# Patient Record
Sex: Male | Born: 1970 | Race: Black or African American | Hispanic: No | Marital: Married
Health system: Southern US, Community
[De-identification: ages and names within clinical notes are randomized; demographics above are authoritative.]

---

## 2003-01-04 ENCOUNTER — Emergency Department (HOSPITAL_COMMUNITY): Admission: EM | Admit: 2003-01-04 | Discharge: 2003-01-04 | Payer: Self-pay | Admitting: Emergency Medicine

## 2003-05-11 ENCOUNTER — Emergency Department (HOSPITAL_COMMUNITY): Admission: EM | Admit: 2003-05-11 | Discharge: 2003-05-11 | Payer: Self-pay | Admitting: Family Medicine

## 2003-11-10 ENCOUNTER — Encounter: Admission: RE | Admit: 2003-11-10 | Discharge: 2003-11-10 | Payer: Self-pay | Admitting: Family Medicine

## 2003-11-20 ENCOUNTER — Ambulatory Visit (HOSPITAL_COMMUNITY): Admission: RE | Admit: 2003-11-20 | Discharge: 2003-11-20 | Payer: Self-pay | Admitting: Obstetrics and Gynecology

## 2007-03-30 ENCOUNTER — Emergency Department: Payer: Self-pay | Admitting: Emergency Medicine

## 2008-06-02 ENCOUNTER — Emergency Department (HOSPITAL_COMMUNITY): Admission: EM | Admit: 2008-06-02 | Discharge: 2008-06-02 | Payer: Self-pay | Admitting: Emergency Medicine

## 2008-12-10 IMAGING — CT CT HEAD WITHOUT CONTRAST
2 series · 16 of 30 positions shown, 20 images · non-contrast
Comparison: none

REASON FOR EXAM: syncope
COMMENTS:   LMP: (Male)

PROCEDURE:     CT  - CT HEAD WITHOUT CONTRAST  - March 30, 2007  [DATE]
RESULT:     Comparison: No available comparison exam.
Procedure: CT examination of the head was performed without intravenous
contrast. Collimation is 5 mm.

[Series 2: without · axial · non-contrast · 0.42mm/px · z∈[+470,+600]mm · 13 of 32 slices shown, 17 images]
[im 3/32  brain]
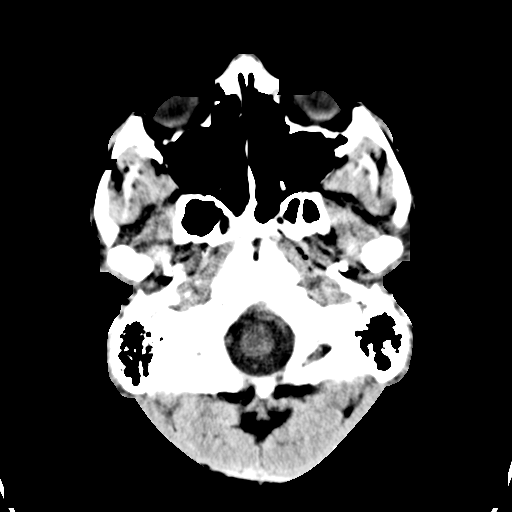
[im 3/32  bone]
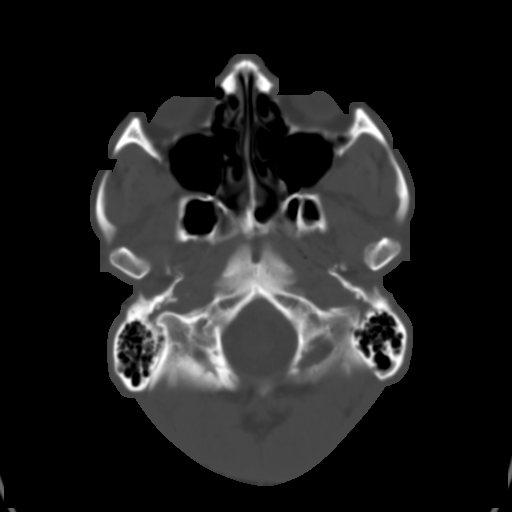
[im 5/32  brain]
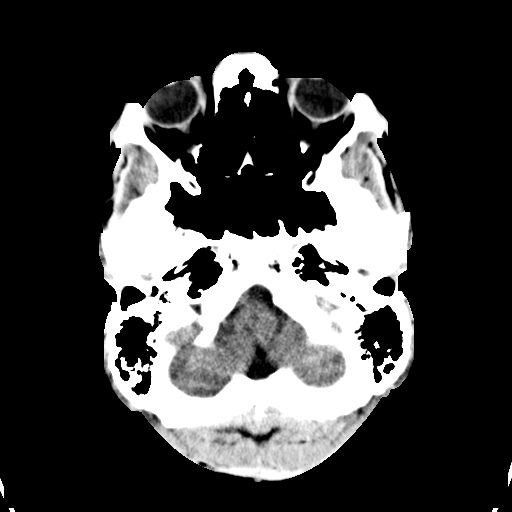
[im 7/32  brain]
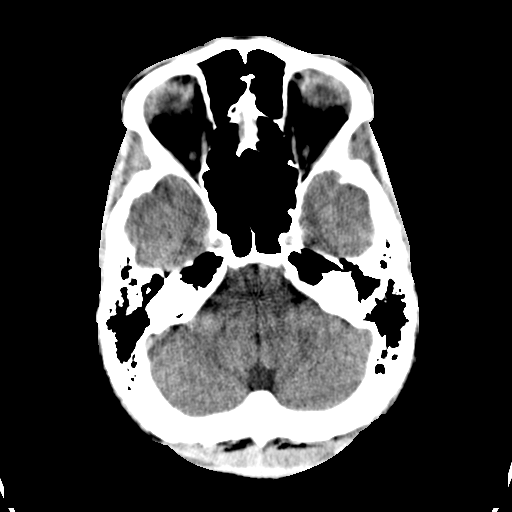
[im 9/32  brain]
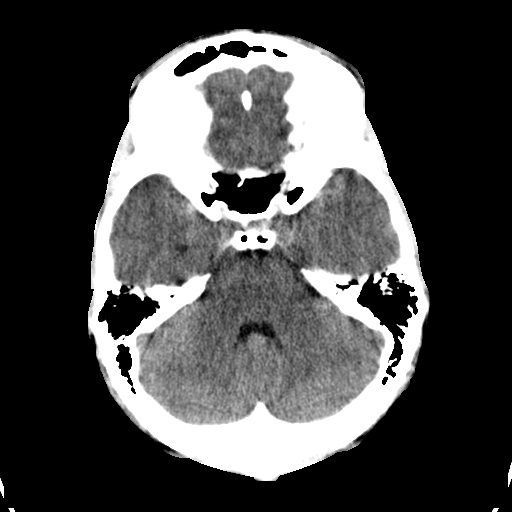
[im 12/32  brain]
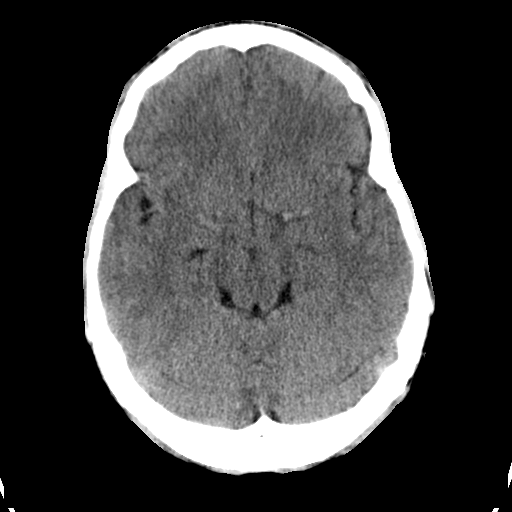
[im 12/32  bone]
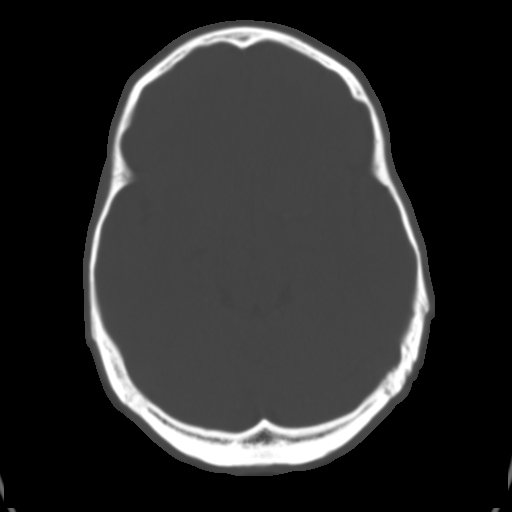
[im 14/32  brain]
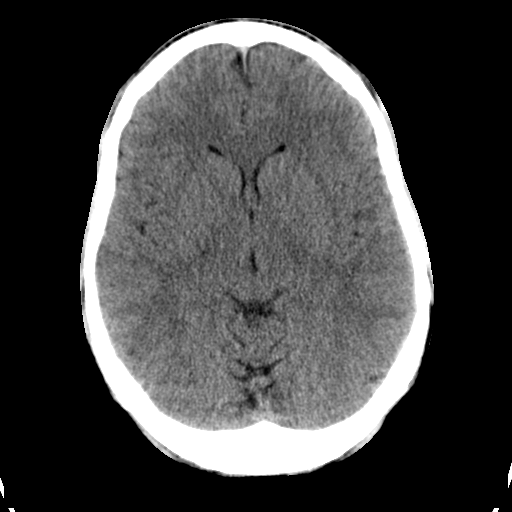
[im 16/32  brain]
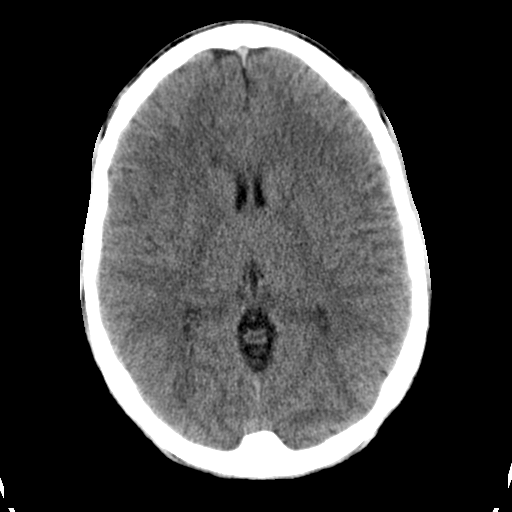
[im 18/32  brain]
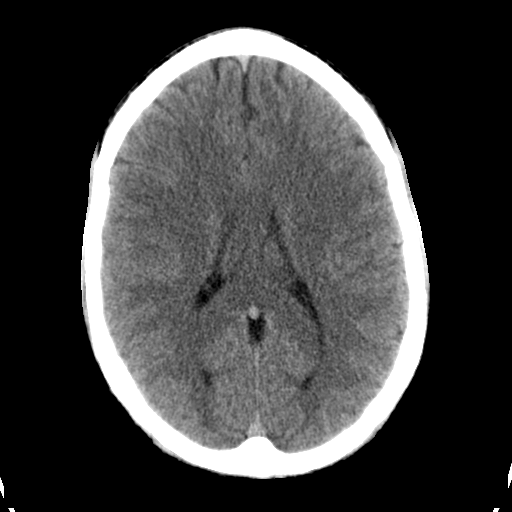
[im 20/32  brain]
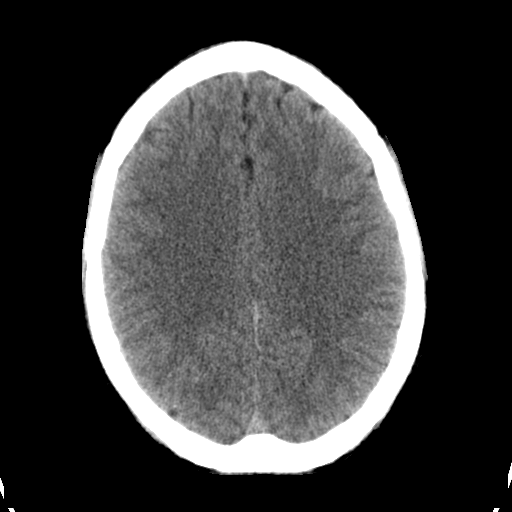
[im 20/32  bone]
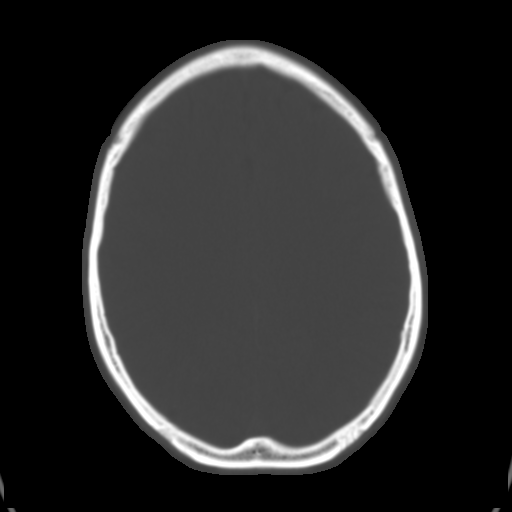
[im 23/32  brain]
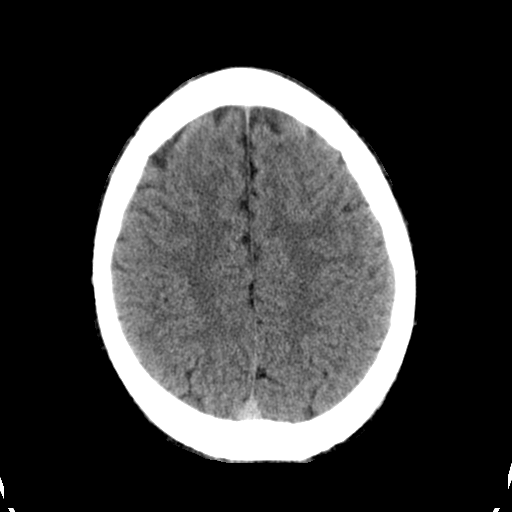
[im 25/32  brain]
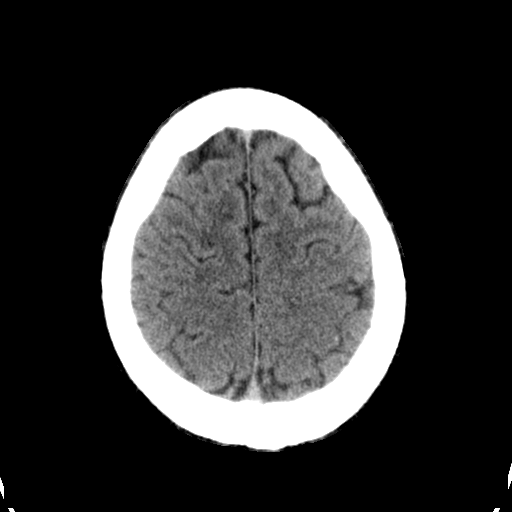
[im 27/32  brain]
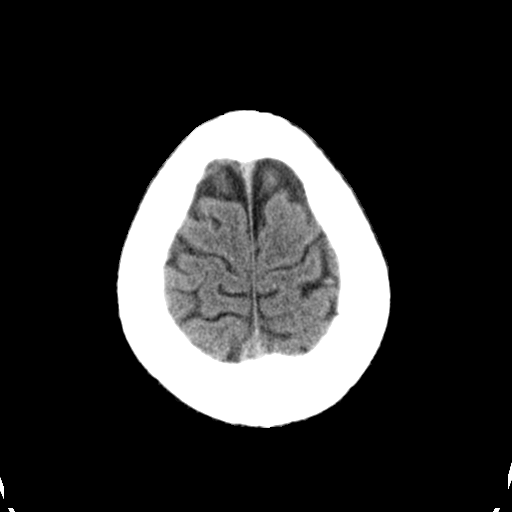
[im 29/32  brain]
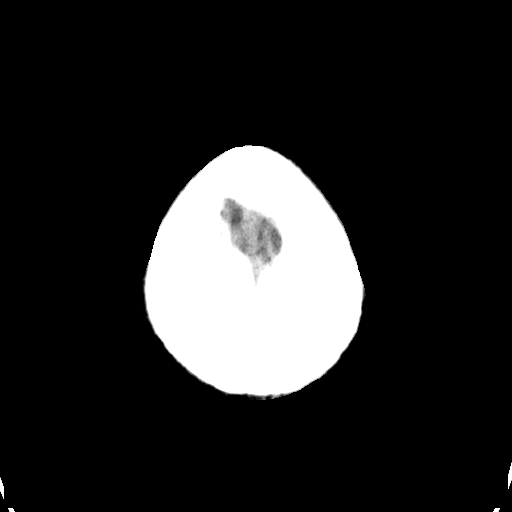
[im 29/32  bone]
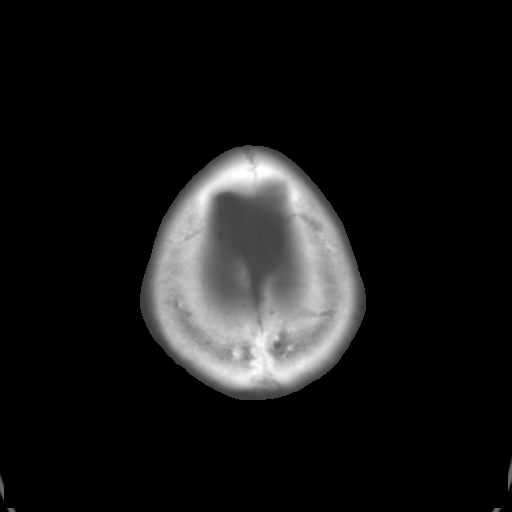

[Series 3: bone · axial · 0.42mm/px · z∈[+470,+516]mm · 3 of 32 slices shown]
[im 3/32  bone]
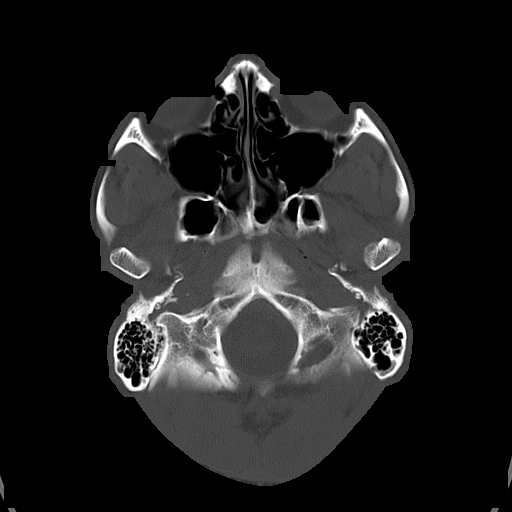
[im 7/32  bone]
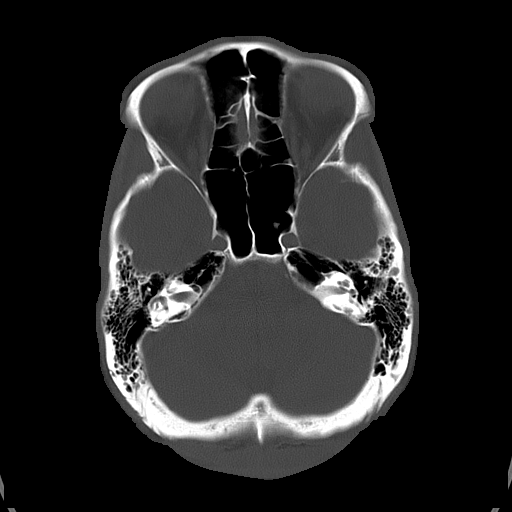
[im 12/32  bone]
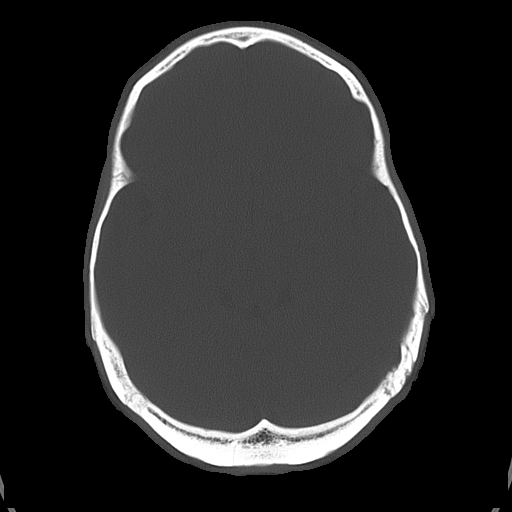

[16 of 30 positions shown; findings below may reference images not displayed]

FINDINGS: No evidence of intracranial hemorrhage, mass-effect, or ventricular
dilatation. The gray and white matters are differentiated. No displaced
calvarial fracture is noted. The partially visualized paranasal sinuses and
mastoid air cells are unremarkable.
IMPRESSION: 1. Unremarkable noncontrast CT of the head.

Preliminary report was faxed to the emergency room by the night radiologist
shortly after the study was performed.

## 2010-01-10 ENCOUNTER — Emergency Department: Payer: Self-pay

## 2010-02-13 IMAGING — CT CT HEAD W/O CM
1 series · 16 of 30 positions shown, 20 images · non-contrast
Comparison: None

CLINICAL DATA: Syncope.  Head trauma.

CT HEAD WITHOUT CONTRAST
TECHNIQUE: Contiguous axial images were obtained from the base of
the skull through the vertex without contrast.

[Series 2: head trauma 4.8 h37s · axial · 0.45mm/px · z∈[+1139,+1299]mm · 16 of 36 slices shown, 20 images]
[im 2/36  brain]
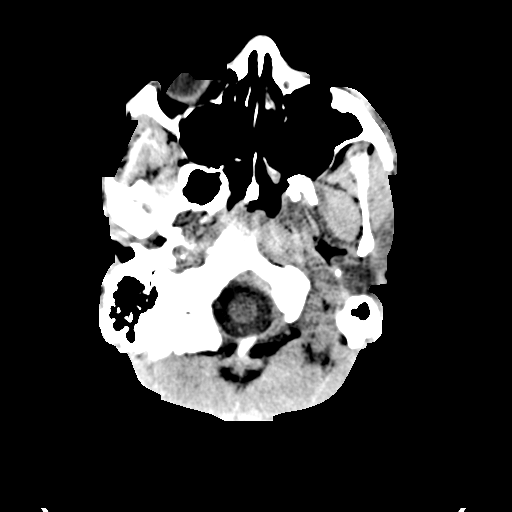
[im 2/36  bone]
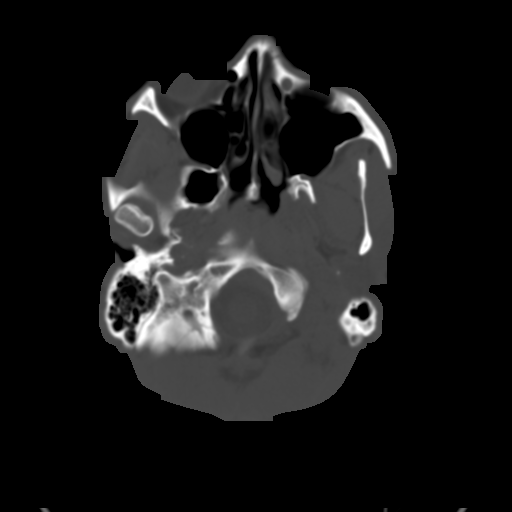
[im 4/36  brain]
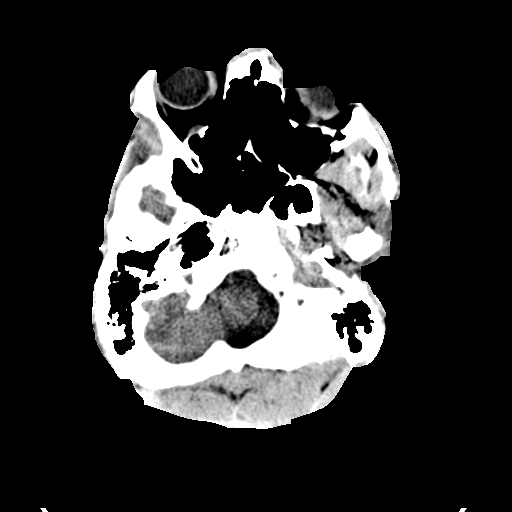
[im 7/36  brain]
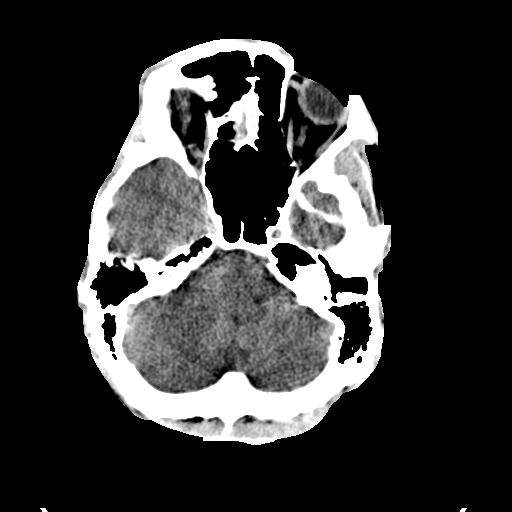
[im 9/36  brain]
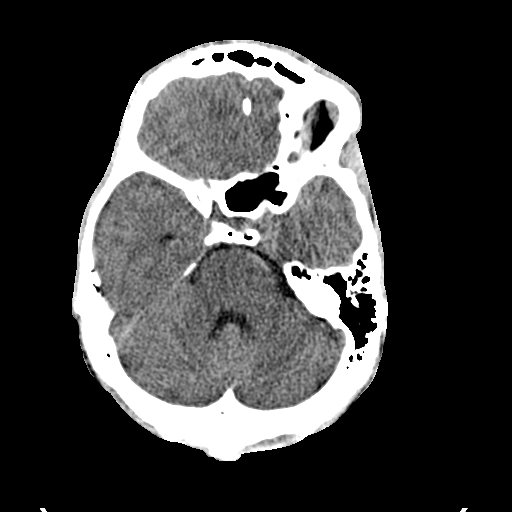
[im 10/36  brain]
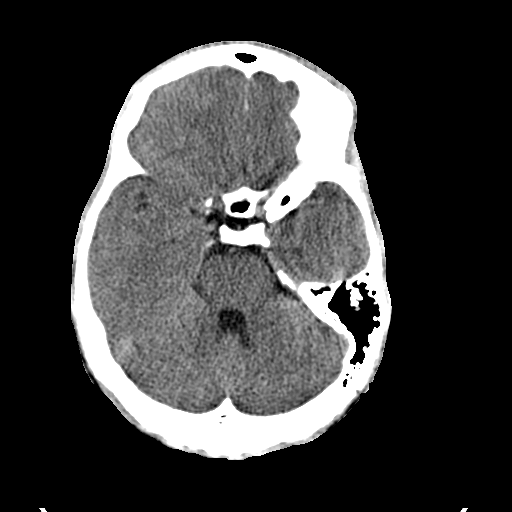
[im 10/36  bone]
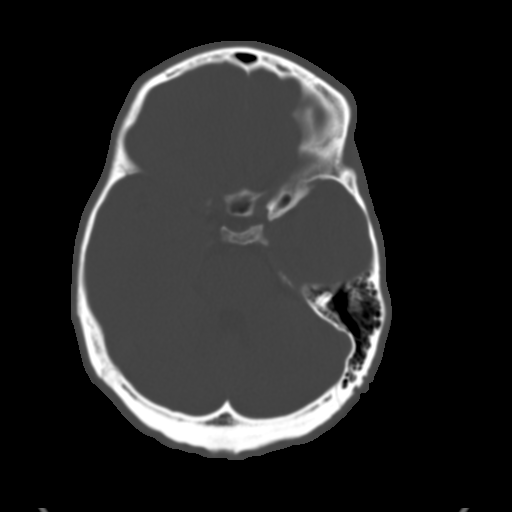
[im 13/36  brain]
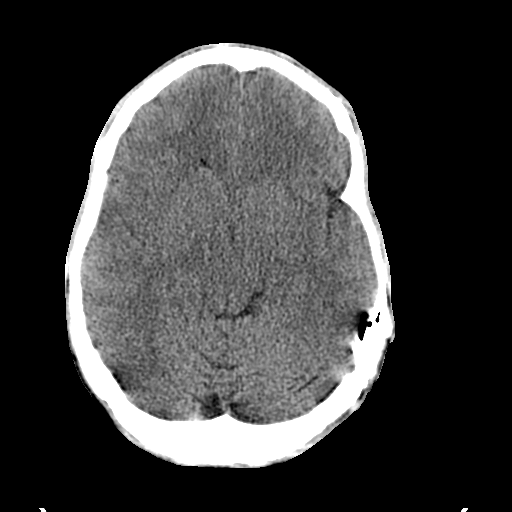
[im 15/36  brain]
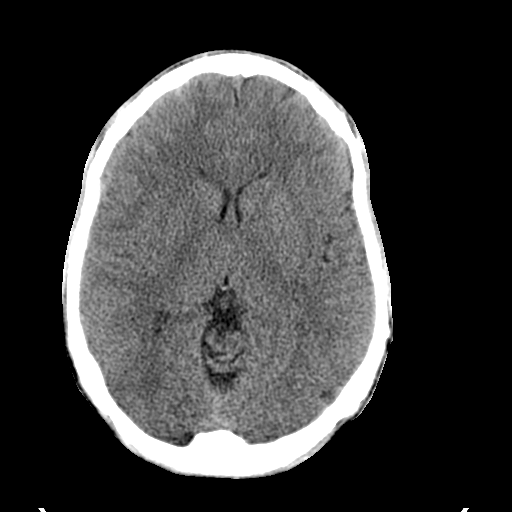
[im 17/36  brain]
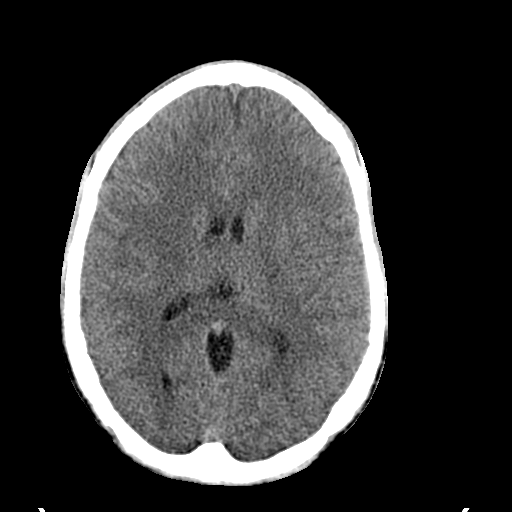
[im 19/36  brain]
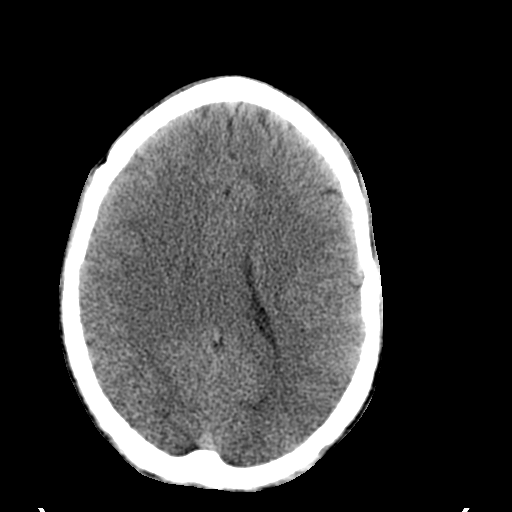
[im 19/36  bone]
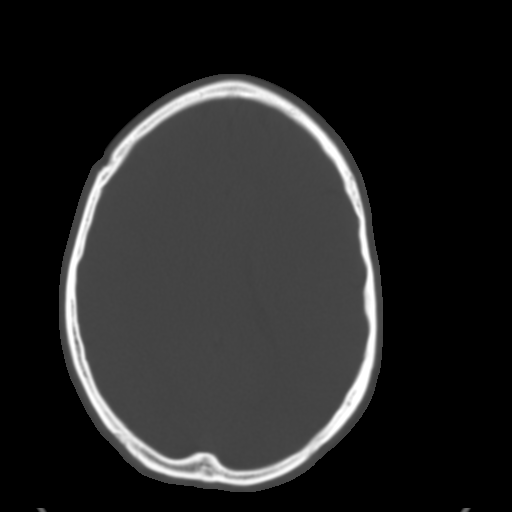
[im 21/36  brain]
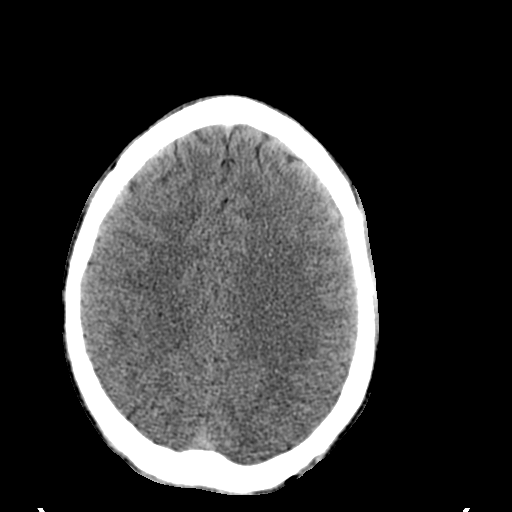
[im 23/36  brain]
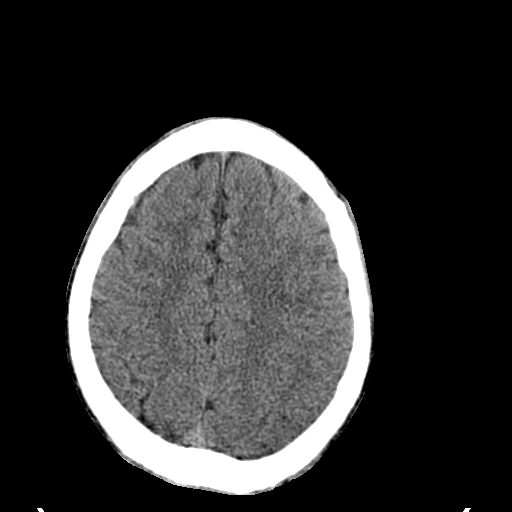
[im 26/36  brain]
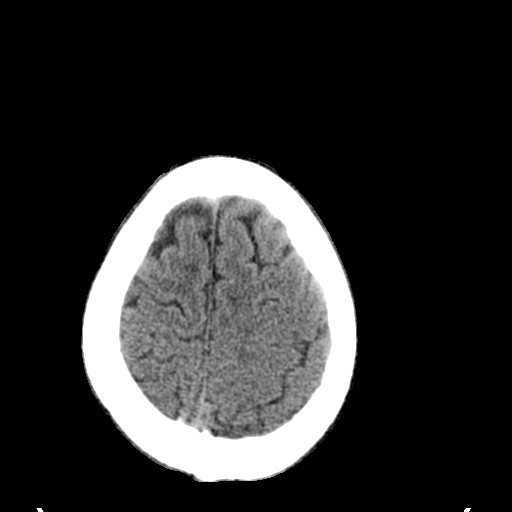
[im 27/36  brain]
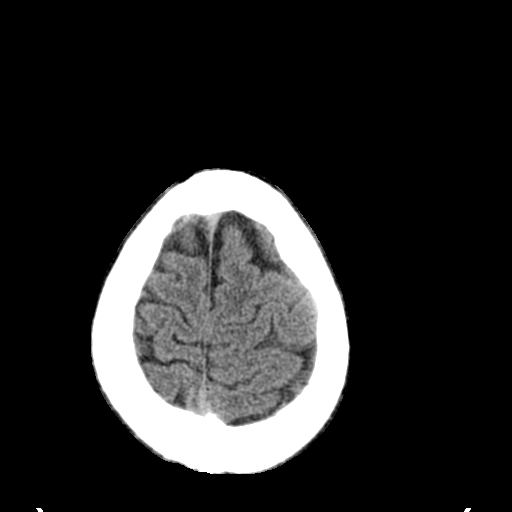
[im 27/36  bone]
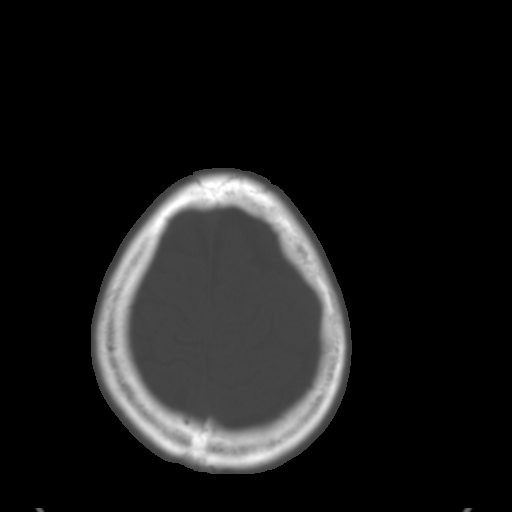
[im 29/36  brain]
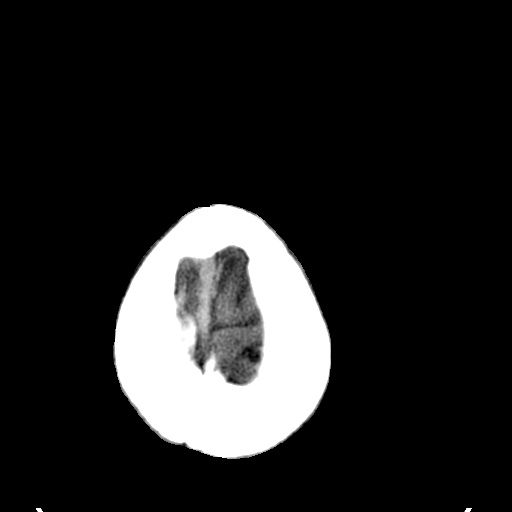
[im 32/36  brain]
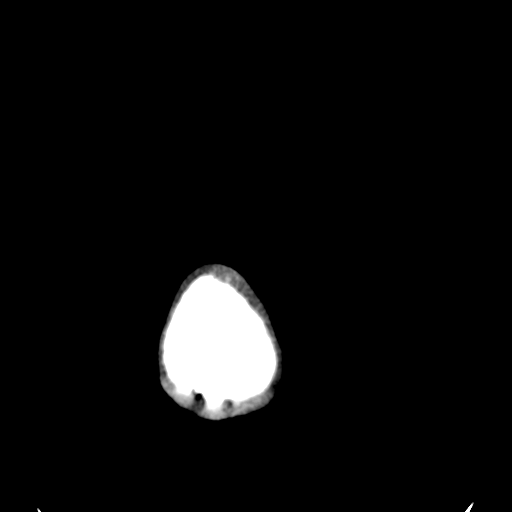
[im 34/36  brain]
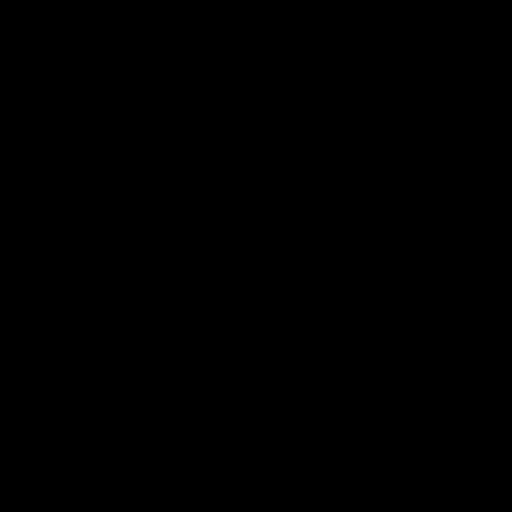

[16 of 30 positions shown; findings below may reference images not displayed]

FINDINGS: The brain has a normal appearance without evidence of
malformation, old or acute infarction, mass lesion, hemorrhage,
hydrocephalus or extra-axial collection.  The calvarium is
unremarkable.  The sinuses, middle ears and mastoids are clear.
IMPRESSION: Normal head CT

## 2010-03-11 ENCOUNTER — Emergency Department: Payer: Self-pay | Admitting: Emergency Medicine

## 2010-05-07 LAB — URINALYSIS, ROUTINE W REFLEX MICROSCOPIC
Bilirubin Urine: NEGATIVE
Glucose, UA: NEGATIVE mg/dL
Hgb urine dipstick: NEGATIVE
Ketones, ur: NEGATIVE mg/dL
Nitrite: NEGATIVE
Protein, ur: NEGATIVE mg/dL
Specific Gravity, Urine: 1.018 (ref 1.005–1.030)
Urobilinogen, UA: 1 mg/dL (ref 0.0–1.0)
pH: 6 (ref 5.0–8.0)

## 2010-05-07 LAB — POCT I-STAT, CHEM 8
BUN: 11 mg/dL (ref 6–23)
Calcium, Ion: 1.07 mmol/L — ABNORMAL LOW (ref 1.12–1.32)
Chloride: 107 mEq/L (ref 96–112)
Creatinine, Ser: 1.3 mg/dL (ref 0.4–1.5)
Glucose, Bld: 111 mg/dL — ABNORMAL HIGH (ref 70–99)
HCT: 43 % (ref 39.0–52.0)
Hemoglobin: 14.6 g/dL (ref 13.0–17.0)
Potassium: 3.5 mEq/L (ref 3.5–5.1)
Sodium: 141 mEq/L (ref 135–145)
TCO2: 24 mmol/L (ref 0–100)

## 2010-05-07 LAB — URINE MICROSCOPIC-ADD ON

## 2010-05-07 LAB — CBC
HCT: 40.2 % (ref 39.0–52.0)
Hemoglobin: 14.1 g/dL (ref 13.0–17.0)
MCHC: 34.9 g/dL (ref 30.0–36.0)
MCV: 89.9 fL (ref 78.0–100.0)
Platelets: 154 10*3/uL (ref 150–400)
RBC: 4.47 MIL/uL (ref 4.22–5.81)
RDW: 14 % (ref 11.5–15.5)
WBC: 11.9 10*3/uL — ABNORMAL HIGH (ref 4.0–10.5)

## 2010-05-07 LAB — POCT CARDIAC MARKERS
CKMB, poc: 2.4 ng/mL (ref 1.0–8.0)
Myoglobin, poc: 332 ng/mL (ref 12–200)
Troponin i, poc: <0.05 ng/mL (ref 0.00–0.09)

## 2010-05-07 LAB — DIFFERENTIAL
Basophils Absolute: 0 10*3/uL (ref 0.0–0.1)
Basophils Relative: 0 % (ref 0–1)
Eosinophils Absolute: 0 10*3/uL (ref 0.0–0.7)
Eosinophils Relative: 0 % (ref 0–5)
Lymphocytes Relative: 10 % — ABNORMAL LOW (ref 12–46)
Lymphs Abs: 1.2 10*3/uL (ref 0.7–4.0)
Monocytes Absolute: 0.7 10*3/uL (ref 0.1–1.0)
Monocytes Relative: 6 % (ref 3–12)
Neutro Abs: 9.9 10*3/uL — ABNORMAL HIGH (ref 1.7–7.7)
Neutrophils Relative %: 84 % — ABNORMAL HIGH (ref 43–77)

## 2019-05-06 ENCOUNTER — Ambulatory Visit: Payer: Self-pay | Attending: Internal Medicine

## 2019-05-06 DIAGNOSIS — Z23 Encounter for immunization: Secondary | ICD-10-CM

## 2019-05-06 NOTE — Progress Notes (Signed)
   Covid-[facility] Vaccination [facility]  Name:  Ohn Bostic    MRN: [id number] DOB: 12/24/1970  05/06/2019  Mr. Bojarski was observed post Covid-19 immunization for 15 minutes without incident. He was provided with Vaccine Information Sheet and instruction to access the V-Safe system.   Mr. Hammitt was instructed to call 911 with any severe reactions post vaccine: Marland Kitchen Difficulty breathing  . Swelling of face and throat  . A fast heartbeat  . A bad rash all over body  . Dizziness and weakness   Immunizations Administered    Name Date Dose VIS Date Route   Pfizer COVID-19 Vaccine 05/06/2019  4:08 PM 0.3 mL 01/07/2019 Intramuscular   Manufacturer: [employer], [employer]   Lot: [id number]   NDC: [address]-7

## 2019-05-31 ENCOUNTER — Ambulatory Visit: Payer: Self-pay | Attending: Internal Medicine

## 2019-05-31 DIAGNOSIS — Z23 Encounter for immunization: Secondary | ICD-10-CM

## 2019-05-31 NOTE — Progress Notes (Signed)
   Covid-19 Vaccination Clinic  Name:  Ernest Mcgee    MRN: [id number] DOB: September 18, 1970  05/31/2019  Mr. Ernest Mcgee was observed post Covid-19 immunization for 15 minutes without incident. He was provided with Vaccine Information Sheet and instruction to access the V-Safe system.   Mr. Ernest Mcgee was instructed to call 911 with any severe reactions post vaccine: Marland Kitchen Difficulty breathing  . Swelling of face and throat  . A fast heartbeat  . A bad rash all over body  . Dizziness and weakness   Immunizations Administered    Name Date Dose VIS Date Route   Pfizer COVID-19 Vaccine 05/31/2019  9:29 AM 0.3 mL 03/23/2018 Intramuscular   Manufacturer: [employer], [employer]   Lot: [id number]   NDC: [address]-0
